# Patient Record
Sex: Female | Born: 1995 | Race: White | Hispanic: Yes | Marital: Single | Smoking: Never smoker
Health system: Southern US, Community
[De-identification: ages and names within clinical notes are randomized; demographics above are authoritative.]

## PROBLEM LIST (undated history)

## (undated) DIAGNOSIS — R519 Headache, unspecified: Secondary | ICD-10-CM

## (undated) DIAGNOSIS — K219 Gastro-esophageal reflux disease without esophagitis: Secondary | ICD-10-CM

## (undated) DIAGNOSIS — R51 Headache: Secondary | ICD-10-CM

## (undated) DIAGNOSIS — M419 Scoliosis, unspecified: Secondary | ICD-10-CM

## (undated) HISTORY — DX: Headache, unspecified: R51.9

## (undated) HISTORY — PX: EYE SURGERY: SHX253

## (undated) HISTORY — DX: Gastro-esophageal reflux disease without esophagitis: K21.9

## (undated) HISTORY — DX: Headache: R51

---
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
## 2017-02-20 ENCOUNTER — Telehealth: Payer: Self-pay | Admitting: Family Medicine

## 2017-02-20 NOTE — Telephone Encounter (Signed)
Requesting copy of immunization record.

## 2017-02-20 NOTE — Telephone Encounter (Signed)
Vaccine record ready for pickup. Pt notified.  

## 2017-07-13 ENCOUNTER — Ambulatory Visit (INDEPENDENT_AMBULATORY_CARE_PROVIDER_SITE_OTHER): Payer: BLUE CROSS/BLUE SHIELD | Admitting: Nurse Practitioner

## 2017-07-13 VITALS — BP 102/68 | Temp 97.7°F | Ht 63.0 in | Wt 98.2 lb

## 2017-07-13 DIAGNOSIS — N898 Other specified noninflammatory disorders of vagina: Secondary | ICD-10-CM

## 2017-07-13 DIAGNOSIS — Z113 Encounter for screening for infections with a predominantly sexual mode of transmission: Secondary | ICD-10-CM | POA: Diagnosis not present

## 2017-07-13 NOTE — Patient Instructions (Signed)
Start daily OTC prenatal vitamin

## 2017-07-15 LAB — SPECIMEN STATUS REPORT

## 2017-07-15 LAB — CHLAMYDIA/GONOCOCCUS/TRICHOMONAS, NAA
CHLAMYDIA BY NAA: NEGATIVE
Gonococcus by NAA: NEGATIVE
Trich vag by NAA: NEGATIVE

## 2017-07-16 ENCOUNTER — Encounter: Payer: Self-pay | Admitting: Nurse Practitioner

## 2017-07-16 LAB — POCT WET PREP WITH KOH
CLUE CELLS WET PREP PER HPF POC: NEGATIVE
KOH PREP POC: NEGATIVE
PH WET PREP: 4.5
RBC WET PREP PER HPF POC: NEGATIVE
Trichomonas, UA: NEGATIVE
WBC Wet Prep HPF POC: NEGATIVE
Yeast Wet Prep HPF POC: NEGATIVE

## 2017-07-16 NOTE — Progress Notes (Signed)
Subjective:  Presents for c/o vaginal discharge. Began during her last cycle the last week of September. Unusually heavy cycle. Cycles usually regular with normal flow. Cream colored discharge with slight fishy odor only during cycle. No fever or pelvic pain. No urinary symptoms. Sexually active. Has only had the one partner. No birth control. Plans to conceive in a few months.   Objective:   BP 102/68   Temp 97.7 F (36.5 C) (Oral)   Ht  (1.6 m)   Wt 98 lb 3.2 oz (44.5 kg)   BMI 17.40 kg/m  NAD. Alert, oriented. Lungs clear. Heart RRR. Abdomen soft, non distended, non tender. External GU: no rashes or lesions. Vagina: no erythema, moderate amount white mucoid discharge noted. Some difficulty with speculum exam due to anxiety.  No CMT. Bimanual exam: mild mid line tenderness; no adnexal tenderness or masses. No rebound or guarding.  Results for orders placed or performed in visit on 07/13/17                                  POCT Wet Prep with KOH  Result Value Ref Range   Trichomonas, UA Negative    Clue Cells Wet Prep HPF POC neg    Epithelial Wet Prep HPF POC Moderate Few, Moderate, Many, Too numerous to count   Yeast Wet Prep HPF POC neg    Bacteria Wet Prep HPF POC None (A) Few   RBC Wet Prep HPF POC neg    WBC Wet Prep HPF POC neg    KOH Prep POC Negative Negative   pH, Wet Prep 4.5      Assessment:  Vaginal discharge  Screen for STD (sexually transmitted disease) - Plan: Chlamydia/Gonococcus/Trichomonas, NAA    Plan:  Defers need for contraceptive. Most likely mid cycle ovulatory pain and increased normal discharge. Start daily prenatal vitamin. STD testing pending. Warning signs reviewed. Call back if worsens or persists.

## 2018-07-01 ENCOUNTER — Other Ambulatory Visit: Payer: Self-pay

## 2018-07-01 ENCOUNTER — Emergency Department (HOSPITAL_COMMUNITY): Payer: No Typology Code available for payment source

## 2018-07-01 ENCOUNTER — Emergency Department (HOSPITAL_COMMUNITY)
Admission: EM | Admit: 2018-07-01 | Discharge: 2018-07-01 | Disposition: A | Discharge status: Home / Self Care | Payer: No Typology Code available for payment source | Attending: Emergency Medicine | Admitting: Emergency Medicine

## 2018-07-01 ENCOUNTER — Encounter (HOSPITAL_COMMUNITY): Payer: Self-pay | Admitting: Emergency Medicine

## 2018-07-01 DIAGNOSIS — Y92410 Unspecified street and highway as the place of occurrence of the external cause: Secondary | ICD-10-CM | POA: Diagnosis not present

## 2018-07-01 DIAGNOSIS — R0602 Shortness of breath: Secondary | ICD-10-CM | POA: Diagnosis not present

## 2018-07-01 DIAGNOSIS — Y9389 Activity, other specified: Secondary | ICD-10-CM | POA: Diagnosis not present

## 2018-07-01 DIAGNOSIS — Z79899 Other long term (current) drug therapy: Secondary | ICD-10-CM | POA: Insufficient documentation

## 2018-07-01 DIAGNOSIS — S39012A Strain of muscle, fascia and tendon of lower back, initial encounter: Secondary | ICD-10-CM | POA: Diagnosis not present

## 2018-07-01 DIAGNOSIS — Y999 Unspecified external cause status: Secondary | ICD-10-CM | POA: Insufficient documentation

## 2018-07-01 DIAGNOSIS — S3992XA Unspecified injury of lower back, initial encounter: Secondary | ICD-10-CM | POA: Diagnosis present

## 2018-07-01 DIAGNOSIS — R0789 Other chest pain: Secondary | ICD-10-CM | POA: Insufficient documentation

## 2018-07-01 HISTORY — DX: Scoliosis, unspecified: M41.9

## 2018-07-01 LAB — URINALYSIS, ROUTINE W REFLEX MICROSCOPIC
Bilirubin Urine: NEGATIVE
Glucose, UA: NEGATIVE mg/dL
Hgb urine dipstick: NEGATIVE
KETONES UR: NEGATIVE mg/dL
Leukocytes, UA: NEGATIVE
NITRITE: NEGATIVE
PH: 5 (ref 5.0–8.0)
Protein, ur: NEGATIVE mg/dL
SPECIFIC GRAVITY, URINE: 1.021 (ref 1.005–1.030)

## 2018-07-01 LAB — PREGNANCY, URINE: Preg Test, Ur: NEGATIVE

## 2018-07-01 MED ORDER — ONDANSETRON HCL 4 MG/2ML IJ SOLN
4.0000 mg | Freq: Once | INTRAMUSCULAR | Status: AC
  Administered 2018-07-01: 4 mg via INTRAVENOUS
  Filled 2018-07-01: qty 2

## 2018-07-01 MED ORDER — IBUPROFEN 400 MG PO TABS
400.0000 mg | ORAL_TABLET | Freq: Once | ORAL | Status: AC
  Administered 2018-07-01: 400 mg via ORAL
  Filled 2018-07-01: qty 1

## 2018-07-01 NOTE — ED Notes (Signed)
MD spoke with patient who agreed to stay and wait for Xrays.   Patient given PO fluids and crackers. Tolerating well.
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
## 2018-07-01 NOTE — ED Notes (Signed)
Patient verbalizes understanding of medications and discharge instructions. No further questions at this time. VSS and patient ambulatory at discharge.   

## 2018-07-01 NOTE — ED Notes (Signed)
Patient requesting to leave. States "I would rather follow up with my regular doctor. I've been waiting for hours already".   This RN explained to the patient and family we have to have a negative pregnancy test before Xrays are done.   MD made aware.
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
## 2018-07-01 NOTE — Discharge Instructions (Addendum)
You may take tylenol and ibuprofen for back pain and muscle pain.  Drink plenty of fluids.  Get rechecked immediately if you have any new or concerning symptoms.

## 2019-06-13 ENCOUNTER — Other Ambulatory Visit: Payer: Self-pay

## 2019-06-13 DIAGNOSIS — Z20822 Contact with and (suspected) exposure to covid-19: Secondary | ICD-10-CM

## 2019-06-15 LAB — NOVEL CORONAVIRUS, NAA: SARS-CoV-2, NAA: NOT DETECTED

## 2019-10-21 ENCOUNTER — Other Ambulatory Visit: Payer: Self-pay

## 2019-10-21 ENCOUNTER — Ambulatory Visit: Payer: Self-pay | Attending: Internal Medicine

## 2019-10-21 DIAGNOSIS — Z20822 Contact with and (suspected) exposure to covid-19: Secondary | ICD-10-CM

## 2019-10-22 LAB — NOVEL CORONAVIRUS, NAA: SARS-CoV-2, NAA: NOT DETECTED

## 2019-10-29 ENCOUNTER — Encounter: Payer: Self-pay | Admitting: Family Medicine

## 2019-10-30 ENCOUNTER — Encounter: Payer: Self-pay | Admitting: Family Medicine

## 2021-05-25 ENCOUNTER — Other Ambulatory Visit: Payer: Self-pay

## 2021-05-25 ENCOUNTER — Emergency Department
Admission: EM | Admit: 2021-05-25 | Discharge: 2021-05-25 | Disposition: A | Discharge status: Home / Self Care | Payer: Commercial Managed Care - PPO | Attending: Student in an Organized Health Care Education/Training Program | Admitting: Student in an Organized Health Care Education/Training Program

## 2021-05-25 ENCOUNTER — Emergency Department: Payer: Commercial Managed Care - PPO

## 2021-05-25 DIAGNOSIS — N939 Abnormal uterine and vaginal bleeding, unspecified: Secondary | ICD-10-CM | POA: Insufficient documentation

## 2021-05-25 DIAGNOSIS — K219 Gastro-esophageal reflux disease without esophagitis: Secondary | ICD-10-CM | POA: Insufficient documentation

## 2021-05-25 DIAGNOSIS — R1032 Left lower quadrant pain: Secondary | ICD-10-CM

## 2021-05-25 DIAGNOSIS — R52 Pain, unspecified: Secondary | ICD-10-CM

## 2021-05-25 LAB — COMPREHENSIVE METABOLIC PANEL
ALT: 12 U/L (ref 0–44)
AST: 16 U/L (ref 15–41)
Albumin: 3.9 g/dL (ref 3.5–5.0)
Alkaline Phosphatase: 69 U/L (ref 38–126)
Anion gap: 9 (ref 5–15)
BUN: 8 mg/dL (ref 6–20)
CO2: 25 mmol/L (ref 22–32)
Calcium: 9.1 mg/dL (ref 8.9–10.3)
Chloride: 104 mmol/L (ref 98–111)
Creatinine, Ser: 0.48 mg/dL (ref 0.44–1.00)
GFR, Estimated: >60 mL/min (ref >60)
Glucose, Bld: 85 mg/dL (ref 70–99)
Potassium: 3.6 mmol/L (ref 3.5–5.1)
Sodium: 138 mmol/L (ref 135–145)
Total Bilirubin: 1.2 mg/dL (ref 0.3–1.2)
Total Protein: 7.3 g/dL (ref 6.5–8.1)

## 2021-05-25 LAB — URINALYSIS, COMPLETE (UACMP) WITH MICROSCOPIC
Bacteria, UA: NONE SEEN
Bilirubin Urine: NEGATIVE
Glucose, UA: NEGATIVE mg/dL
Ketones, ur: NEGATIVE mg/dL
Leukocytes,Ua: NEGATIVE
Nitrite: NEGATIVE
Protein, ur: NEGATIVE mg/dL
Specific Gravity, Urine: 1.008 (ref 1.005–1.030)
pH: 7 (ref 5.0–8.0)

## 2021-05-25 LAB — CBC
HCT: 36.5 % (ref 36.0–46.0)
Hemoglobin: 12.4 g/dL (ref 12.0–15.0)
MCH: 29.7 pg (ref 26.0–34.0)
MCHC: 34 g/dL (ref 30.0–36.0)
MCV: 87.5 fL (ref 80.0–100.0)
Platelets: 279 10*3/uL (ref 150–400)
RBC: 4.17 MIL/uL (ref 3.87–5.11)
RDW: 13.2 % (ref 11.5–15.5)
WBC: 5.7 10*3/uL (ref 4.0–10.5)
nRBC: 0 % (ref 0.0–0.2)

## 2021-05-25 LAB — POC URINE PREG, ED: Preg Test, Ur: NEGATIVE

## 2021-05-25 LAB — LIPASE, BLOOD: Lipase: 30 U/L (ref 11–51)

## 2021-05-25 MED ORDER — MELOXICAM 15 MG PO TABS: 15.0000 mg | ORAL_TABLET | Freq: Every day | ORAL | 0 refills | Status: AC

## 2021-05-25 MED ORDER — ACETAMINOPHEN 325 MG PO TABS
650.0000 mg | ORAL_TABLET | Freq: Once | ORAL | Status: AC
  Administered 2021-05-25: 650 mg via ORAL
  Filled 2021-05-25: qty 2

## 2021-05-25 MED ORDER — KETOROLAC TROMETHAMINE 60 MG/2ML IM SOLN
30.0000 mg | Freq: Once | INTRAMUSCULAR | Status: AC
  Administered 2021-05-25: 30 mg via INTRAMUSCULAR
  Filled 2021-05-25: qty 2

## 2021-05-25 NOTE — ED Triage Notes (Signed)
She was seen at Urgent care and they sent her here to be evaluated for an ovarian torsion.

## 2021-05-25 NOTE — ED Triage Notes (Signed)
Pt reports that she developed left lower pelvic pain last night all the sudden that woke her from her sleep. Denies any N/V/D. She did start her period yesterday morning and today she did see a big blood clot.

## 2021-05-25 NOTE — ED Provider Notes (Signed)
[facility] Emergency Department Provider Note  ____________________________________________   Event Date/Time   First MD Initiated Contact with Patient 05/25/21 1127     (approximate)  I have reviewed the triage vital signs and the nursing notes.   HISTORY  Chief Complaint Pelvic Pain   HPI Gabrielle Chavez is a 25 y.o. female who presents to the ER from [facility] for evaluation of pain. Patient states she awoke last night in the middle of the night with LLQ pain radiating into the pelvic region. She reports starting her menstrual cycle yesterday as well, and passed a large clot this AM which is atypical of her. She denies any fevers, nausea, vomiting, changes in bowels, or dysuria. She denies any possibility of STD or pregnancy. Was sent from UC to r/o torsion. She reports her menstruation is usually regular, does endorse 2 last month which is atypical for her.        Past Medical History:  Diagnosis Date   Acid reflux    Headache    Scoliosis     Patient Active Problem List   Diagnosis Date Noted   Precordial pain 06/07/2016   Migraine without aura and without status migrainosus, not intractable 11/05/2013   Tension headache 11/05/2013   Chronic headaches 09/11/2013    Past Surgical History:  Procedure Laterality Date   EYE SURGERY Bilateral    Tear Duct Surgery   WISDOM TOOTH EXTRACTION Bilateral May 2016   All 4 wisdom teeth were removed    Prior to Admission medications   Medication Sig Start Date End Date Taking? Authorizing Provider  meloxicam (MOBIC) 15 MG tablet Take 1 tablet (15 mg total) by mouth daily for 15 days. 05/25/21 06/09/21 Yes Tyeler Goedken, Ruben Gottron, PA  albuterol (PROVENTIL HFA;VENTOLIN HFA) 108 (90 Base) MCG/ACT inhaler Inhale 2 puffs into the lungs every 6 (six) hours as needed for wheezing or shortness of breath. 09/11/16   Merlyn Albert, MD  ondansetron (ZOFRAN ODT) 4 MG disintegrating tablet Take 1 tablet (4 mg total) by  mouth every 8 (eight) hours as needed for nausea or vomiting. 04/26/16   Merlyn Albert, MD  ranitidine (ZANTAC) 300 MG tablet Take 300 mg by mouth at bedtime.    [provider]    Allergies Patient has no known allergies.  Family History  Problem Relation Age of Onset   Depression Brother        Older Brother   Migraines Maternal Grandmother    Stomach cancer Maternal Grandfather    Liver cancer Paternal Grandfather    Diabetes Paternal Grandfather    Cancer Paternal Grandfather    Asthma Paternal Grandfather    Arthritis Paternal Grandfather    Migraines Mother     Social History Social History   Tobacco Use   Smoking status: Never   Smokeless tobacco: Never  Substance Use Topics   Alcohol use: No   Drug use: No    Review of Systems Constitutional: No fever/chills Eyes: No visual changes. ENT: No sore throat. Cardiovascular: Denies chest pain. Respiratory: Denies shortness of breath. Gastrointestinal: LLQ abdominal pain.  No nausea, no vomiting.  No diarrhea.  No constipation. Genitourinary: +pelvic pain, Negative for dysuria. Musculoskeletal: Negative for back pain. Skin: Negative for rash. Neurological: Negative for headaches, focal weakness or numbness.   ____________________________________________   PHYSICAL EXAM:  VITAL SIGNS: ED Triage Vitals [05/25/21 1111]  Enc Vitals Group     BP (!) 128/113     Pulse Rate  75     Resp 16     Temp 98.6 F (37 C)     Temp Source Oral     SpO2 100 %     Weight 109 lb (49.4 kg)     Height 5' 2.5" (1.588 m)     Head Circumference      Peak Flow      Pain Score 8     Pain Loc      Pain Edu?      Excl. in GC?    Constitutional: Alert and oriented. Well appearing and in no acute distress. Eyes: Conjunctivae are normal. PERRL. EOMI. Head: Atraumatic. Nose: No congestion/rhinnorhea. Mouth/Throat: Mucous membranes are moist.  Oropharynx non-erythematous. Neck: No stridor.   Cardiovascular: Normal  rate, regular rhythm. Grossly normal heart sounds.  Good peripheral circulation. Respiratory: Normal respiratory effort.  No retractions. Lungs CTAB. Gastrointestinal: Soft and non-distended. TTP of the LUQ and LLQ without rebound or guarding. Mild suprapubic tenderness, no RUQ or RLQ pain. No abdominal bruits. No CVA tenderness. Genitourinary: Deferred, patient's choice Musculoskeletal: No lower extremity tenderness nor edema.  No joint effusions. Neurologic:  Normal speech and language. No gross focal neurologic deficits are appreciated. No gait instability. Skin:  Skin is warm, dry and intact. No rash noted. Psychiatric: Mood and affect are normal. Speech and behavior are normal.  ____________________________________________   LABS (all labs ordered are listed, but only abnormal results are displayed)  Labs Reviewed  URINALYSIS, COMPLETE (UACMP) WITH MICROSCOPIC - Abnormal; Notable for the following components:      Result Value   Color, Urine STRAW (*)    APPearance CLEAR (*)    Hgb urine dipstick LARGE (*)    All other components within normal limits  LIPASE, BLOOD  COMPREHENSIVE METABOLIC PANEL  CBC  POC URINE PREG, ED    ____________________________________________  RADIOLOGY  Official radiology report(s): US PELVIC COMPLETE W TRANSVAGINAL AND TORSION R/O  Result Date: 05/25/2021 CLINICAL DATA:  Sudden onset pelvic pain. EXAM: TRANSABDOMINAL AND TRANSVAGINAL ULTRASOUND OF PELVIS DOPPLER ULTRASOUND OF OVARIES TECHNIQUE: Both transabdominal and transvaginal ultrasound examinations of the pelvis were performed. Transabdominal technique was performed for global imaging of the pelvis including uterus, ovaries, adnexal regions, and pelvic cul-de-sac. It was necessary to proceed with endovaginal exam following the transabdominal exam to visualize the endometrium, ovaries, and adnexa. Color and duplex Doppler ultrasound was utilized to evaluate blood flow to the ovaries. COMPARISON:   None. FINDINGS: Uterus Measurements: 7.3 x 3.5 x 4.0 cm = volume: 53 mL. No fibroids or other mass visualized. Endometrium Thickness: 3 mm.  No focal abnormality visualized. Right ovary Measurements: 3.1 x 1.5 x 3.3 cm = volume: 8.0 mL. Normal appearance/no adnexal mass. Left ovary Measurements: 4.2 x 2.3 x 2.3 cm = volume: 11.4 mL. Normal appearance/no adnexal mass. Corpus luteum noted. Pulsed Doppler evaluation of both ovaries demonstrates normal low-resistance arterial and venous waveforms. Other findings Trace free fluid in the pelvis, likely physiologic. IMPRESSION: 1. Normal pelvic ultrasound. Electronically Signed   By: Obie Dredge M.D.   On: 05/25/2021 12:43     ____________________________________________   INITIAL IMPRESSION / ASSESSMENT AND PLAN / ED COURSE  As part of my medical decision making, I reviewed the following data within the electronic MEDICAL RECORD NUMBER Nursing notes reviewed and incorporated, Labs reviewed, and Notes from prior ED visits        Patient presents to the ER for evaluation of LLQ abdominal pain that started last night, a few  hours after starting menstruation. She did have passage of large clot which is atypical for her. Denies other associated symptoms, see HPI.  In triage, patient has mild hypertension otherwise normal vital signs. PE as above.  Initial ddx: menstrual cramps, pregnancy (IUP or ectopic), TOA, ovarian cyst, ovarian torsion, UTI, viral colitis, other intraabdominal pathology  Labs WNL, including POC urine pregnancy which is verbally reported by nursing to me as negative. US obtained, negative for significant ovarian cyst or evidence of torsion. Otherwise normal as well.   Discussed had with the patient. She is reporting improvement but not full resolution with Tylenol administration provided in triage. Discussed possibility of obtaining vaginal swabs to r/o yeast, BV and STDs however the patient is confident of no possibility of STDs and  defers this. Risks/benefits of abdominal CT were discussed, but with lack of other associated symptoms at this time, particularly with no peritoneal signs, discussed low suspicion. Shared decision making was had to defer this imaging at this time. Discussed treatment for menstrual related symptoms with antiinflammatory and continued Tylenol at home. Patient is amenable with this plan, strict return precautions were discussed.      ____________________________________________   FINAL CLINICAL IMPRESSION(S) / ED DIAGNOSES  Final diagnoses:  Pain  LLQ pain  Vaginal bleeding     ED Discharge Orders          Ordered    meloxicam (MOBIC) 15 MG tablet  Daily        05/25/21 1311             Note:  This document was prepared using Dragon voice recognition software and may include unintentional dictation errors.    Lucy Chris, PA 05/25/21 1312    Willy Eddy, MD 05/25/21 1346

## 2021-05-25 NOTE — Discharge Instructions (Addendum)
Please take Mobic starting tomorrow as prescribed. You may also take Tylenol, up to 1000mg  4x daily as needed. Return to the ER with any fevers, nausea/vomiting or worsening of pain. Otherwise, follow up with primary care.

## 2021-06-17 ENCOUNTER — Encounter: Payer: Self-pay | Admitting: Emergency Medicine

## 2021-06-17 ENCOUNTER — Emergency Department: Payer: Commercial Managed Care - PPO

## 2021-06-17 ENCOUNTER — Emergency Department
Admission: EM | Admit: 2021-06-17 | Discharge: 2021-06-17 | Disposition: A | Discharge status: Home / Self Care | Payer: Commercial Managed Care - PPO | Attending: Emergency Medicine | Admitting: Emergency Medicine

## 2021-06-17 ENCOUNTER — Other Ambulatory Visit: Payer: Self-pay

## 2021-06-17 DIAGNOSIS — R1013 Epigastric pain: Secondary | ICD-10-CM | POA: Diagnosis not present

## 2021-06-17 DIAGNOSIS — R0602 Shortness of breath: Secondary | ICD-10-CM | POA: Insufficient documentation

## 2021-06-17 DIAGNOSIS — E860 Dehydration: Secondary | ICD-10-CM | POA: Diagnosis not present

## 2021-06-17 DIAGNOSIS — R7989 Other specified abnormal findings of blood chemistry: Secondary | ICD-10-CM

## 2021-06-17 DIAGNOSIS — R000 Tachycardia, unspecified: Secondary | ICD-10-CM | POA: Insufficient documentation

## 2021-06-17 DIAGNOSIS — R0789 Other chest pain: Secondary | ICD-10-CM | POA: Diagnosis not present

## 2021-06-17 DIAGNOSIS — R079 Chest pain, unspecified: Secondary | ICD-10-CM

## 2021-06-17 LAB — CBC
HCT: 38.8 % (ref 36.0–46.0)
Hemoglobin: 13.7 g/dL (ref 12.0–15.0)
MCH: 30.6 pg (ref 26.0–34.0)
MCHC: 35.3 g/dL (ref 30.0–36.0)
MCV: 86.8 fL (ref 80.0–100.0)
Platelets: 282 10*3/uL (ref 150–400)
RBC: 4.47 MIL/uL (ref 3.87–5.11)
RDW: 12.7 % (ref 11.5–15.5)
WBC: 10.3 10*3/uL (ref 4.0–10.5)
nRBC: 0 % (ref 0.0–0.2)

## 2021-06-17 LAB — URINALYSIS, COMPLETE (UACMP) WITH MICROSCOPIC
Bilirubin Urine: NEGATIVE
Glucose, UA: NEGATIVE mg/dL
Ketones, ur: 20 mg/dL — AB
Leukocytes,Ua: NEGATIVE
Nitrite: NEGATIVE
Protein, ur: NEGATIVE mg/dL
Specific Gravity, Urine: 1.019 (ref 1.005–1.030)
pH: 7 (ref 5.0–8.0)

## 2021-06-17 LAB — COMPREHENSIVE METABOLIC PANEL
ALT: 52 U/L — ABNORMAL HIGH (ref 0–44)
AST: 69 U/L — ABNORMAL HIGH (ref 15–41)
Albumin: 4.2 g/dL (ref 3.5–5.0)
Alkaline Phosphatase: 66 U/L (ref 38–126)
Anion gap: 8 (ref 5–15)
BUN: 9 mg/dL (ref 6–20)
CO2: 25 mmol/L (ref 22–32)
Calcium: 8.9 mg/dL (ref 8.9–10.3)
Chloride: 101 mmol/L (ref 98–111)
Creatinine, Ser: 0.55 mg/dL (ref 0.44–1.00)
GFR, Estimated: >60 mL/min (ref >60)
Glucose, Bld: 93 mg/dL (ref 70–99)
Potassium: 3.9 mmol/L (ref 3.5–5.1)
Sodium: 134 mmol/L — ABNORMAL LOW (ref 135–145)
Total Bilirubin: 0.6 mg/dL (ref 0.3–1.2)
Total Protein: 7.6 g/dL (ref 6.5–8.1)

## 2021-06-17 LAB — D-DIMER, QUANTITATIVE: D-Dimer, Quant: 1.83 ug/mL-FEU — ABNORMAL HIGH (ref 0.00–0.50)

## 2021-06-17 LAB — TROPONIN I (HIGH SENSITIVITY): Troponin I (High Sensitivity): 3 ng/L (ref <18)

## 2021-06-17 LAB — LIPASE, BLOOD: Lipase: 38 U/L (ref 11–51)

## 2021-06-17 LAB — POC URINE PREG, ED: Preg Test, Ur: NEGATIVE

## 2021-06-17 MED ORDER — ALUM & MAG HYDROXIDE-SIMETH 200-200-20 MG/5ML PO SUSP
30.0000 mL | Freq: Once | ORAL | Status: AC
  Administered 2021-06-17: 30 mL via ORAL
  Filled 2021-06-17: qty 30

## 2021-06-17 MED ORDER — IOHEXOL 350 MG/ML SOLN
75.0000 mL | Freq: Once | INTRAVENOUS | Status: AC | PRN
  Administered 2021-06-17: 75 mL via INTRAVENOUS
  Filled 2021-06-17: qty 75

## 2021-06-17 MED ORDER — SUCRALFATE 1 G PO TABS: 1.0000 g | ORAL_TABLET | Freq: Four times a day (QID) | ORAL | 0 refills | Status: DC

## 2021-06-17 MED ORDER — SUCRALFATE 1 G PO TABS
1.0000 g | ORAL_TABLET | Freq: Once | ORAL | Status: AC
  Administered 2021-06-17: 1 g via ORAL
  Filled 2021-06-17: qty 1

## 2021-06-17 MED ORDER — DICYCLOMINE HCL 10 MG PO CAPS: 10.0000 mg | ORAL_CAPSULE | Freq: Four times a day (QID) | ORAL | 0 refills | Status: AC

## 2021-06-17 MED ORDER — DICYCLOMINE HCL 10 MG PO CAPS: 10.0000 mg | ORAL_CAPSULE | Freq: Four times a day (QID) | ORAL | 0 refills | Status: DC

## 2021-06-17 MED ORDER — SUCRALFATE 1 G PO TABS: 1.0000 g | ORAL_TABLET | Freq: Four times a day (QID) | ORAL | 0 refills | Status: AC

## 2021-06-17 MED ORDER — LACTATED RINGERS IV BOLUS
1000.0000 mL | Freq: Once | INTRAVENOUS | Status: AC
  Administered 2021-06-17: 1000 mL via INTRAVENOUS

## 2021-06-17 NOTE — ED Triage Notes (Signed)
C/O acid reflux, which resolved and now c/o upper abdominal and chest pain.  States unable to eat since Monday.  AAOx3.  Skin warm and dry. NAD

## 2021-06-17 NOTE — ED Provider Notes (Signed)
[facility] Emergency Department Provider Note  ____________________________________________   Event Date/Time   First MD Initiated Contact with Patient 06/17/21 1708     (approximate)  I have reviewed the triage vital signs and the nursing notes.   HISTORY  Chief Complaint No chief complaint on file.   HPI Gabrielle Chavez is a 25 y.o. female with a past medical history of migraine headaches NSAIDs 2 or 3 times a week, scoliosis and a past medical history of GERD who presents for assessment of approximately 5 days of some chest pain, shortness of breath and epigastric pain.  Patient states it is similar to prior reflux pain she has had a past although much worse.  She states when it started she felt acid in her upper esophagus and was nauseous but over the last couple days it seemed to move down and she feels tightness in her chest and upper abdomen.  She states she has not been able to eat or drink anything except for some medicines with fluids.  She states she has been having regular bowel movements that may be a little more watery than usual.  She denies any headache the last couple days, earache, sore throat, cough, fever, back pain, recent injuries or falls or rashes or any burning with urination or abnormal vaginal bleeding or discharge.  Patient states she had 1 shot of liquor and a beer tonight this all started but otherwise is not a regular drinker has not had any alcohol since.         Past Medical History:  Diagnosis Date   Acid reflux    Headache    Scoliosis     Patient Active Problem List   Diagnosis Date Noted   Precordial pain 06/07/2016   Migraine without aura and without status migrainosus, not intractable 11/05/2013   Tension headache 11/05/2013   Chronic headaches 09/11/2013    Past Surgical History:  Procedure Laterality Date   EYE SURGERY Bilateral    Tear Duct Surgery   WISDOM TOOTH EXTRACTION Bilateral May 2016   All 4  wisdom teeth were removed    Prior to Admission medications   Medication Sig Start Date End Date Taking? Authorizing Provider  dicyclomine (BENTYL) 10 MG capsule Take 1 capsule (10 mg total) by mouth 4 (four) times daily for 3 days. 06/17/21 06/20/21 Yes Gilles Chiquito, MD  sucralfate (CARAFATE) 1 g tablet Take 1 tablet (1 g total) by mouth 4 (four) times daily for 7 days. 06/17/21 06/24/21 Yes Gilles Chiquito, MD  albuterol (PROVENTIL HFA;VENTOLIN HFA) 108 (90 Base) MCG/ACT inhaler Inhale 2 puffs into the lungs every 6 (six) hours as needed for wheezing or shortness of breath. 09/11/16   Merlyn Albert, MD  ondansetron (ZOFRAN ODT) 4 MG disintegrating tablet Take 1 tablet (4 mg total) by mouth every 8 (eight) hours as needed for nausea or vomiting. 04/26/16   Merlyn Albert, MD  ranitidine (ZANTAC) 300 MG tablet Take 300 mg by mouth at bedtime.    [provider]    Allergies Patient has no known allergies.  Family History  Problem Relation Age of Onset   Depression Brother        Older Brother   Migraines Maternal Grandmother    Stomach cancer Maternal Grandfather    Liver cancer Paternal Grandfather    Diabetes Paternal Grandfather    Cancer Paternal Grandfather    Asthma Paternal Grandfather    Arthritis Paternal Grandfather  Migraines Mother     Social History Social History   Tobacco Use   Smoking status: Never   Smokeless tobacco: Never  Substance Use Topics   Alcohol use: No   Drug use: No    Review of Systems  Review of Systems  Constitutional:  Negative for chills and fever.  HENT:  Negative for sore throat.   Eyes:  Negative for pain.  Respiratory:  Positive for shortness of breath. Negative for cough and stridor.   Cardiovascular:  Positive for chest pain.  Gastrointestinal:  Positive for abdominal pain and nausea. Negative for vomiting.  Genitourinary:  Negative for dysuria.  Musculoskeletal:  Negative for myalgias.  Skin:  Negative for  rash.  Neurological:  Negative for seizures, loss of consciousness and headaches.  Psychiatric/Behavioral:  Negative for suicidal ideas.   All other systems reviewed and are negative.    ____________________________________________   PHYSICAL EXAM:  VITAL SIGNS: ED Triage Vitals  Enc Vitals Group     BP 06/17/21 1407 107/83     Pulse Rate 06/17/21 1407 (!) 102     Resp 06/17/21 1407 15     Temp 06/17/21 1407 98.7 F (37.1 C)     Temp Source 06/17/21 1407 Oral     SpO2 --      Weight 06/17/21 1405 106 lb (48.1 kg)     Height 06/17/21 1405 5' 2.5" (1.588 m)     Head Circumference --      Peak Flow --      Pain Score 06/17/21 1404 9     Pain Loc --      Pain Edu? --      Excl. in GC? --    Vitals:   06/17/21 1407 06/17/21 1729  BP: 107/83 111/72  Pulse: (!) 102 93  Resp: 15 15  Temp: 98.7 F (37.1 C)   SpO2:  98%   Physical Exam Vitals and nursing note reviewed.  Constitutional:      General: She is not in acute distress.    Appearance: She is well-developed.  HENT:     Head: Normocephalic and atraumatic.     Right Ear: External ear normal.     Left Ear: External ear normal.     Nose: Nose normal.     Mouth/Throat:     Mouth: Mucous membranes are dry.  Eyes:     Conjunctiva/sclera: Conjunctivae normal.  Cardiovascular:     Rate and Rhythm: Regular rhythm. Tachycardia present.     Heart sounds: No murmur heard. Pulmonary:     Effort: Pulmonary effort is normal. No respiratory distress.     Breath sounds: Normal breath sounds.  Abdominal:     Palpations: Abdomen is soft.     Tenderness: There is abdominal tenderness.  Musculoskeletal:     Cervical back: Neck supple.  Skin:    General: Skin is warm and dry.     Capillary Refill: Capillary refill takes more than 3 seconds.  Neurological:     Mental Status: She is alert and oriented to person, place, and time.  Psychiatric:        Mood and Affect: Mood normal.      ____________________________________________   LABS (all labs ordered are listed, but only abnormal results are displayed)  Labs Reviewed  COMPREHENSIVE METABOLIC PANEL - Abnormal; Notable for the following components:      Result Value   Sodium 134 (*)    AST 69 (*)    ALT 52 (*)  All other components within normal limits  URINALYSIS, COMPLETE (UACMP) WITH MICROSCOPIC - Abnormal; Notable for the following components:   Color, Urine YELLOW (*)    APPearance HAZY (*)    Hgb urine dipstick SMALL (*)    Ketones, ur 20 (*)    Bacteria, UA FEW (*)    All other components within normal limits  D-DIMER, QUANTITATIVE - Abnormal; Notable for the following components:   D-Dimer, Quant 1.83 (*)    All other components within normal limits  LIPASE, BLOOD  CBC  POC URINE PREG, ED  TROPONIN I (HIGH SENSITIVITY)  TROPONIN I (HIGH SENSITIVITY)   ____________________________________________  EKG  ECG shows sinus tachycardia with ventricular to 106, normal axis, unremarkable intervals.  Q waves in inferior leads without other clearance of acute ischemia or significant arrhythmia. ____________________________________________  RADIOLOGY  ED MD interpretation: CTA has no evidence of PE, pneumonia or other acute thoracic process.  There is some esophageal wall thickening concerning for possible esophagitis.   Official radiology report(s): CT Angio Chest PE W and/or Wo Contrast  Result Date: 06/17/2021 CLINICAL DATA:  Pulmonary embolus suspected with high probability. Acid reflux resolved. Now upper abdominal and chest pain. Unable to eat since Monday. EXAM: CT ANGIOGRAPHY CHEST WITH CONTRAST TECHNIQUE: Multidetector CT imaging of the chest was performed using the standard protocol during bolus administration of intravenous contrast. Multiplanar CT image reconstructions and MIPs were obtained to evaluate the vascular anatomy. CONTRAST:  18mL OMNIPAQUE IOHEXOL 350 MG/ML SOLN COMPARISON:   Chest radiograph 07/01/2018 FINDINGS: Cardiovascular: Good opacification of the central and segmental pulmonary arteries. No focal filling defects. No evidence of significant pulmonary embolus. Normal heart size. No pericardial effusions. Normal caliber thoracic aorta. No dissection. Great vessel origins are patent. Mediastinum/Nodes: Esophagus is decompressed. Suggestion of esophageal wall thickening which may indicate reflux disease or esophagitis. No enlarged mediastinal, hilar, or axillary lymph nodes. Thyroid gland, trachea, and major airways demonstrate no significant findings. Lungs/Pleura: Lungs are clear. No pleural effusion or pneumothorax. Upper Abdomen: No acute abnormality. Musculoskeletal: No chest wall abnormality. No acute or significant osseous findings. Review of the MIP images confirms the above findings. IMPRESSION: 1. No evidence of significant pulmonary embolus. 2. Lungs are clear. 3. Decompression of esophagus limits evaluation but suggestion of esophageal wall thickening. Electronically Signed   By: Burman Nieves M.D.   On: 06/17/2021 19:03    ____________________________________________   PROCEDURES  Procedure(s) performed (including Critical Care):  Procedures   ____________________________________________   INITIAL IMPRESSION / ASSESSMENT AND PLAN / ED COURSE      Patient presents with above-stated history exam for assessment of some shortness of breath, chest tightness and pain in the epigastric area.  This is associate with some nausea and some loose stools.  She denies frequent stools or overt diarrhea.  Denies any urinary symptoms or lower abdominal pain.  On arrival she is slight tachycardic, there is some mild epigastric tenderness and she does appear dehydrated.  Differential includes gastritis, peptic ulcer disease, esophageal spasm and esophagitis, pancreatitis, cholecystitis, gastroenteritis and possibly PE or pneumonia or myocarditis given associated  shortness of breath.  CBC shows no leukocytosis and very slight transaminitis without any other acute metabolic or electrolyte derangements.  No evidence of cholestasis.  No focal tenderness in the right upper quadrant positive Murphy sign to suggest acute cholecystitis.  Lipase not consistent with acute pancreatitis.  CBC shows no leukocytosis or acute anemia.  UA is not suggestive of cystitis but does have some ketones consistent  with some mild dehydration.  Pregnancy test is negative.  ECG and nonelevated troponin are not suggestive of ACS or myocarditis.  CTA has no evidence of PE, pneumonia or other acute thoracic process.  There is some esophageal wall thickening concerning for possible esophagitis.   Overall I suspect likely gastritis and esophagitis.  Patient given some Carafate and Maalox on reassessment states she was feeling better.  She was able to tolerate p.o.'s.  Given stable vitals with eyes reassuring exam work-up and low suspicion for acute cholecystitis, SBO, PE, ACS or myocarditis I think she is stable for discharge with close outpatient PCP and GI follow-up.  Rx written for Bentyl and Carafate.  She is already on omeprazole.  Advised to continue this.  Discharged stable condition.  Strict return precautions advised and discussed.    ____________________________________________   FINAL CLINICAL IMPRESSION(S) / ED DIAGNOSES  Final diagnoses:  Chest pain, unspecified type  SOB (shortness of breath)  Positive D dimer  Dehydration  Epigastric pain    Medications  lactated ringers bolus 1,000 mL (1,000 mLs Intravenous New Bag/Given 06/17/21 1737)  alum & mag hydroxide-simeth (MAALOX/MYLANTA) 200-200-20 MG/5ML suspension 30 mL (30 mLs Oral Given 06/17/21 1737)  sucralfate (CARAFATE) tablet 1 g (1 g Oral Given 06/17/21 1737)  iohexol (OMNIPAQUE) 350 MG/ML injection 75 mL (75 mLs Intravenous Contrast Given 06/17/21 1838)     ED Discharge Orders          Ordered    sucralfate  (CARAFATE) 1 g tablet  4 times daily        06/17/21 1846    dicyclomine (BENTYL) 10 MG capsule  4 times daily        06/17/21 1846             Note:  This document was prepared using Dragon voice recognition software and may include unintentional dictation errors.    Gilles Chiquito, MD 06/17/21 [phone]

## 2021-07-04 ENCOUNTER — Telehealth: Payer: Self-pay | Admitting: Family Medicine

## 2021-07-04 NOTE — Telephone Encounter (Signed)
Patient requested copy of her pap exam from 07/13/2017. No PCP listed but saw Dr. Lorin Picket 10/30/19. Please advise.  CB#[phone]

## 2021-11-01 ENCOUNTER — Ambulatory Visit: Payer: Commercial Managed Care - PPO | Admitting: Psychiatry

## 2021-11-04 ENCOUNTER — Ambulatory Visit: Payer: Commercial Managed Care - PPO | Admitting: Psychiatry

## 2022-06-26 ENCOUNTER — Other Ambulatory Visit: Payer: Self-pay

## 2022-06-26 ENCOUNTER — Encounter (HOSPITAL_COMMUNITY): Payer: Self-pay | Admitting: Emergency Medicine

## 2022-06-26 ENCOUNTER — Emergency Department (HOSPITAL_COMMUNITY)
Admission: EM | Admit: 2022-06-26 | Discharge: 2022-06-26 | Discharge status: Against Medical Advice | Payer: Commercial Managed Care - PPO | Attending: Physician Assistant | Admitting: Physician Assistant

## 2022-06-26 DIAGNOSIS — Z5321 Procedure and treatment not carried out due to patient leaving prior to being seen by health care provider: Secondary | ICD-10-CM | POA: Insufficient documentation

## 2022-06-26 DIAGNOSIS — J029 Acute pharyngitis, unspecified: Secondary | ICD-10-CM | POA: Diagnosis not present

## 2022-06-26 LAB — GROUP A STREP BY PCR: Group A Strep by PCR: NOT DETECTED

## 2022-06-26 NOTE — ED Notes (Signed)
Pt notified ED staff that she was leaving 

## 2022-06-26 NOTE — ED Triage Notes (Signed)
Pt c/o swollen tonsils and sore throat when swallowing x 1 week; has not been seen by PCP; denies fevers

## 2022-06-26 NOTE — ED Provider Triage Note (Signed)
Emergency Medicine Provider Triage Evaluation Note  Gabrielle Chavez , a 26 y.o. female  was evaluated in triage.  Pt complains of sore throat.  Pain x1 week.  No congestion or rhinorrhea.  States she has a history of tonsil stones.  She thought she had removed 1 earlier on this week.  She also thought her tonsils were bleeding.  No recent surgical procedures.  No shortness of breath.  No neck pain or neck stiffness.  Feels like her sore throat hurts worse left greater than right.  Did have laryngitis earlier in the week which is improving.  Review of Systems  Positive: Sore throat Negative: Fever, neck pain  Physical Exam  BP 112/81 (BP Location: Right Arm)   Pulse 92   Temp 98 F (36.7 C)   Resp 17   SpO2 100%  Gen:   Awake, no distress   Resp:  Normal effort  Mouth:  Uvula midline.  No drooling, dysphagia or trismus.  No obvious PTA or RPA.  Minimal erythema bilateral tonsils however no obvious stones, abscess, exudate or bleeding. MSK:   Moves extremities without difficulty  Other:    Medical Decision Making  Medically screening exam initiated at 7:42 PM.  Appropriate orders placed.  Gabrielle Chavez was informed that the remainder of the evaluation will be completed by another provider, this initial triage assessment does not replace that evaluation, and the importance of remaining in the ED until their evaluation is complete.  Sore throat   Gabrielle Chavez A, PA-C 06/26/22 1944
